# Patient Record
Sex: Female | Born: 1945 | Race: Black or African American | Hispanic: No | Marital: Single | Smoking: Current every day smoker
Health system: Southern US, Community
[De-identification: ages and names within clinical notes are randomized; demographics above are authoritative.]

## PROBLEM LIST (undated history)

## (undated) DIAGNOSIS — I10 Essential (primary) hypertension: Secondary | ICD-10-CM

## (undated) DIAGNOSIS — E119 Type 2 diabetes mellitus without complications: Secondary | ICD-10-CM

## (undated) HISTORY — PX: APPENDECTOMY: SHX54

---

## 2016-02-04 ENCOUNTER — Emergency Department (HOSPITAL_BASED_OUTPATIENT_CLINIC_OR_DEPARTMENT_OTHER)
Admission: EM | Admit: 2016-02-04 | Discharge: 2016-02-04 | Disposition: A | Discharge status: Home / Self Care | Payer: Medicare HMO | Attending: Emergency Medicine | Admitting: Emergency Medicine

## 2016-02-04 ENCOUNTER — Encounter (HOSPITAL_BASED_OUTPATIENT_CLINIC_OR_DEPARTMENT_OTHER): Payer: Self-pay | Admitting: Emergency Medicine

## 2016-02-04 ENCOUNTER — Emergency Department (HOSPITAL_BASED_OUTPATIENT_CLINIC_OR_DEPARTMENT_OTHER): Payer: Medicare HMO

## 2016-02-04 DIAGNOSIS — I10 Essential (primary) hypertension: Secondary | ICD-10-CM | POA: Insufficient documentation

## 2016-02-04 DIAGNOSIS — M542 Cervicalgia: Secondary | ICD-10-CM | POA: Diagnosis not present

## 2016-02-04 DIAGNOSIS — M25512 Pain in left shoulder: Secondary | ICD-10-CM | POA: Insufficient documentation

## 2016-02-04 DIAGNOSIS — R6883 Chills (without fever): Secondary | ICD-10-CM | POA: Insufficient documentation

## 2016-02-04 DIAGNOSIS — F17200 Nicotine dependence, unspecified, uncomplicated: Secondary | ICD-10-CM | POA: Insufficient documentation

## 2016-02-04 DIAGNOSIS — Z7984 Long term (current) use of oral hypoglycemic drugs: Secondary | ICD-10-CM | POA: Insufficient documentation

## 2016-02-04 DIAGNOSIS — J3489 Other specified disorders of nose and nasal sinuses: Secondary | ICD-10-CM | POA: Diagnosis not present

## 2016-02-04 DIAGNOSIS — Z79899 Other long term (current) drug therapy: Secondary | ICD-10-CM | POA: Diagnosis not present

## 2016-02-04 DIAGNOSIS — R109 Unspecified abdominal pain: Secondary | ICD-10-CM | POA: Insufficient documentation

## 2016-02-04 DIAGNOSIS — E119 Type 2 diabetes mellitus without complications: Secondary | ICD-10-CM | POA: Insufficient documentation

## 2016-02-04 DIAGNOSIS — M545 Low back pain, unspecified: Secondary | ICD-10-CM

## 2016-02-04 DIAGNOSIS — R51 Headache: Secondary | ICD-10-CM | POA: Insufficient documentation

## 2016-02-04 DIAGNOSIS — Z7982 Long term (current) use of aspirin: Secondary | ICD-10-CM | POA: Insufficient documentation

## 2016-02-04 HISTORY — DX: Essential (primary) hypertension: I10

## 2016-02-04 HISTORY — DX: Type 2 diabetes mellitus without complications: E11.9

## 2016-02-04 MED ORDER — HYDROCODONE-ACETAMINOPHEN 5-325 MG PO TABS: 1.0000 | ORAL_TABLET | Freq: Four times a day (QID) | ORAL | Status: AC | PRN

## 2016-02-04 MED ORDER — HYDROCODONE-ACETAMINOPHEN 5-325 MG PO TABS
1.0000 | ORAL_TABLET | Freq: Once | ORAL | Status: AC
  Administered 2016-02-04: 1 via ORAL
  Filled 2016-02-04: qty 1

## 2016-02-04 NOTE — ED Provider Notes (Signed)
CSN: [id number]     Arrival date & time 02/04/16  2032 History  By signing my name below, I, Phillis Haggis, attest that this documentation has been prepared under the direction and in the presence of Vanetta Mulders, MD. Electronically Signed: Phillis Haggis, ED Scribe. 02/04/2016. 10:47 PM.   Chief Complaint  Patient presents with  . Back Pain   The history is provided by the patient. No language interpreter was used.  HPI Comments: Mariah Sanchez is a 70 y.o. Female with a hx of DM and HTN who presents to the Emergency Department complaining of gradually resolving bilateral lower back pain that radiates up her back onset 5 days ago. She states that she was unable to get up on Friday due to her back pain. Pt reports intermittent radiating pain to the buttocks that radiates to the front. Pt reports sinus pressure, headache, intermittent left shoulder pain, chills, abdominal pain, and neck pain. She has not taken anything for her pain. Pt denies injury to the area, hx of back pain, use of major blood thinners, hx of bruising or bleeding easily, fever, visual disturbances, cough, sore throat, rhinorrhea, chest pain, SOB, leg swelling, nausea, vomiting, diarrhea, dysuria, rash, numbness, weakness, or headache.   Past Medical History  Diagnosis Date  . Diabetes mellitus without complication (HCC)   . Hypertension    Past Surgical History  Procedure Laterality Date  . Appendectomy    . Abdominal hysterectomy     History reviewed. No pertinent family history. Social History  Substance Use Topics  . Smoking status: Current Every Day Smoker  . Smokeless tobacco: None  . Alcohol Use: Yes   OB History    No data available     Review of Systems  Constitutional: Positive for chills. Negative for fever.  HENT: Positive for sinus pressure. Negative for rhinorrhea and sore throat.   Eyes: Negative for visual disturbance.  Respiratory: Negative for cough and shortness of breath.    Cardiovascular: Negative for chest pain and leg swelling.  Gastrointestinal: Positive for abdominal pain. Negative for nausea, vomiting and diarrhea.  Genitourinary: Negative for dysuria.  Musculoskeletal: Positive for back pain, arthralgias and neck pain.  Skin: Negative for rash.  Neurological: Positive for headaches. Negative for weakness and numbness.  Hematological: Does not bruise/bleed easily.  Psychiatric/Behavioral: Negative for confusion.   Allergies  Review of patient's allergies indicates no known allergies.  Home Medications   Prior to Admission medications   Medication Sig Start Date End Date Taking? Authorizing Provider  allopurinol (ZYLOPRIM) 100 MG tablet Take 100 mg by mouth daily.   Yes Historical Provider, MD  amLODipine (NORVASC) 10 MG tablet Take 10 mg by mouth daily.   Yes Historical Provider, MD  aspirin 81 MG tablet Take 81 mg by mouth daily.   Yes Historical Provider, MD  candesartan (ATACAND) 32 MG tablet Take 32 mg by mouth daily.   Yes Historical Provider, MD  clonazePAM (KLONOPIN) 0.5 MG tablet Take 0.5 mg by mouth 2 (two) times daily as needed for anxiety.   Yes Historical Provider, MD  cyclobenzaprine (FLEXERIL) 5 MG tablet Take 5 mg by mouth 3 (three) times daily as needed for muscle spasms.   Yes Historical Provider, MD  esomeprazole (NEXIUM) 40 MG capsule Take 40 mg by mouth daily at 12 noon.   Yes Historical Provider, MD  folic acid (FOLVITE) 800 MCG tablet Take 400 mcg by mouth daily.   Yes Historical Provider, MD  furosemide (LASIX) 20 MG tablet Take  20 mg by mouth.   Yes Historical Provider, MD  metFORMIN (GLUCOPHAGE) 500 MG tablet Take by mouth 2 (two) times daily with a meal.   Yes Historical Provider, MD  metoprolol succinate (TOPROL-XL) 100 MG 24 hr tablet Take 100 mg by mouth daily. Take with or immediately following a meal.   Yes Historical Provider, MD  montelukast (SINGULAIR) 10 MG tablet Take 10 mg by mouth at bedtime.   Yes Historical  Provider, MD  rosuvastatin (CRESTOR) 40 MG tablet Take 40 mg by mouth daily.   Yes Historical Provider, MD  vitamin E (VITAMIN E) 400 UNIT capsule Take 400 Units by mouth daily.   Yes Historical Provider, MD  HYDROcodone-acetaminophen (NORCO/VICODIN) 5-325 MG tablet Take 1 tablet by mouth every 6 (six) hours as needed for moderate pain. 02/04/16   Vanetta Mulders, MD   BP 163/88 mmHg  Pulse 89  Temp(Src) 98.8 F (37.1 C) (Oral)  Resp 18  Ht  (1.499 m)  Wt 72.576 kg  BMI 32.30 kg/m2  SpO2 99% Physical Exam  Constitutional: She is oriented to person, place, and time. She appears well-developed and well-nourished. No distress.  HENT:  Head: Normocephalic and atraumatic.  Mouth/Throat: Oropharynx is clear and moist and mucous membranes are normal.  Eyes: EOM are normal. Pupils are equal, round, and reactive to light. No scleral icterus.  Neck: Normal range of motion. Neck supple.  Cardiovascular: Normal rate and regular rhythm.   Pulmonary/Chest: Effort normal and breath sounds normal.  Abdominal: Soft. Bowel sounds are normal. There is no tenderness.  Musculoskeletal: Normal range of motion.  No swelling to the bilateral ankles; back non-tender to palpation with no deformity  Lymphadenopathy:    She has no cervical adenopathy.  Neurological: She is alert and oriented to person, place, and time. No cranial nerve deficit. She exhibits normal muscle tone. Coordination normal.  Skin: Skin is warm and dry. She is not diaphoretic.  Psychiatric: She has a normal mood and affect. Her behavior is normal.    ED Course  Procedures (including critical care time) DIAGNOSTIC STUDIES: Oxygen Saturation is 99% on RA, normal by my interpretation.    COORDINATION OF CARE: 9:30 PM-Discussed treatment plan which includes x-ray with pt at bedside and pt agreed to plan.    Labs Review Labs Reviewed - No data to display  Imaging Review Dg Lumbar Spine Complete  02/04/2016  CLINICAL DATA:   Lumbosacral back pain.  Pain for 5 days. EXAM: LUMBAR SPINE - COMPLETE 4+ VIEW COMPARISON:  None. FINDINGS: The alignment is maintained. Vertebral body heights are normal. There is no listhesis. The posterior elements are intact. Endplate spurring at L4-L5 with preservation of disc space. No fracture. Facet arthropathy at L4-L5 and L5-S1. Sacroiliac joints are symmetric, with mild degenerative change. 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 No acute bony abnormality. Electronically Signed   By: Rubye Oaks M.D.   On: 02/04/2016 22:40   I have personally reviewed and evaluated these images and lab results as part of my medical decision-making.   EKG Interpretation None      MDM   Final diagnoses:  Bilateral low back pain without sciatica   Patient without acute injury to the back. No neuro focal deficits. X-rays of the lower back show facet arthropathy in the lower lumbar spine could explain the patient's pain. No evidence of any sciatica. Will treat symptomatically.    I personally performed the services described in this documentation,  which was scribed in my presence. The recorded information has been reviewed and is accurate.      Vanetta Mulders, MD 02/04/16 2253

## 2016-02-04 NOTE — Discharge Instructions (Signed)
X-rays of the lower back with evidence of some arthritic changes but no evidence of any compression fractures. Take pain medicine as directed. Make an appoint with follow-up with your regular doctor. Return for any new or worse symptoms.

## 2016-02-04 NOTE — ED Notes (Signed)
C/o lower back pain since Friday,  Denies inj, ambulatory without diff  Denies urinary sx

## 2016-02-04 NOTE — ED Notes (Signed)
Pt in c/o bilateral lower back pain onset today. States some radiation into buttocks now. Ambulatory with steady gait in NAD.

## 2017-03-18 IMAGING — CR DG LUMBAR SPINE COMPLETE 4+V
5 series · 5 of 5 positions shown · non-contrast
Comparison: None.

CLINICAL DATA: Lumbosacral back pain.  Pain for 5 days.

EXAM:
LUMBAR SPINE - COMPLETE 4+ VIEW

[t l-spine a.p.]
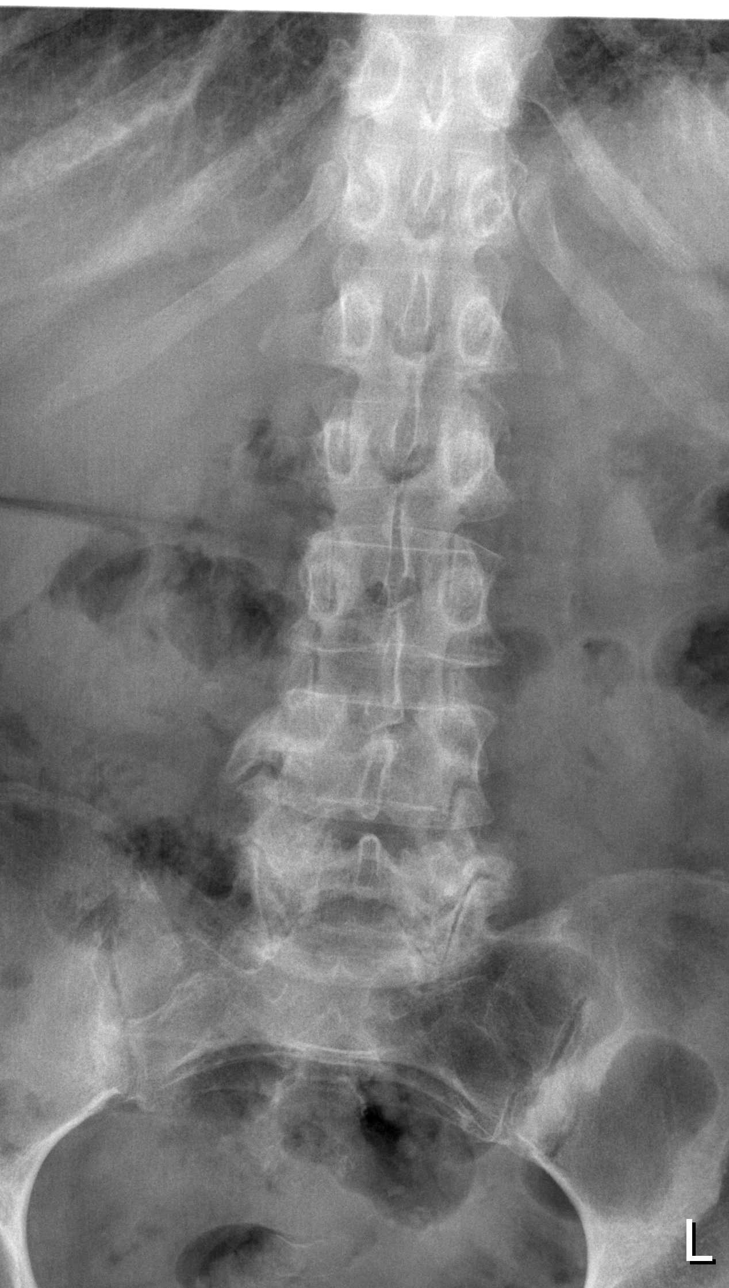

[t l-spine oblique exposure (1 of 2)]
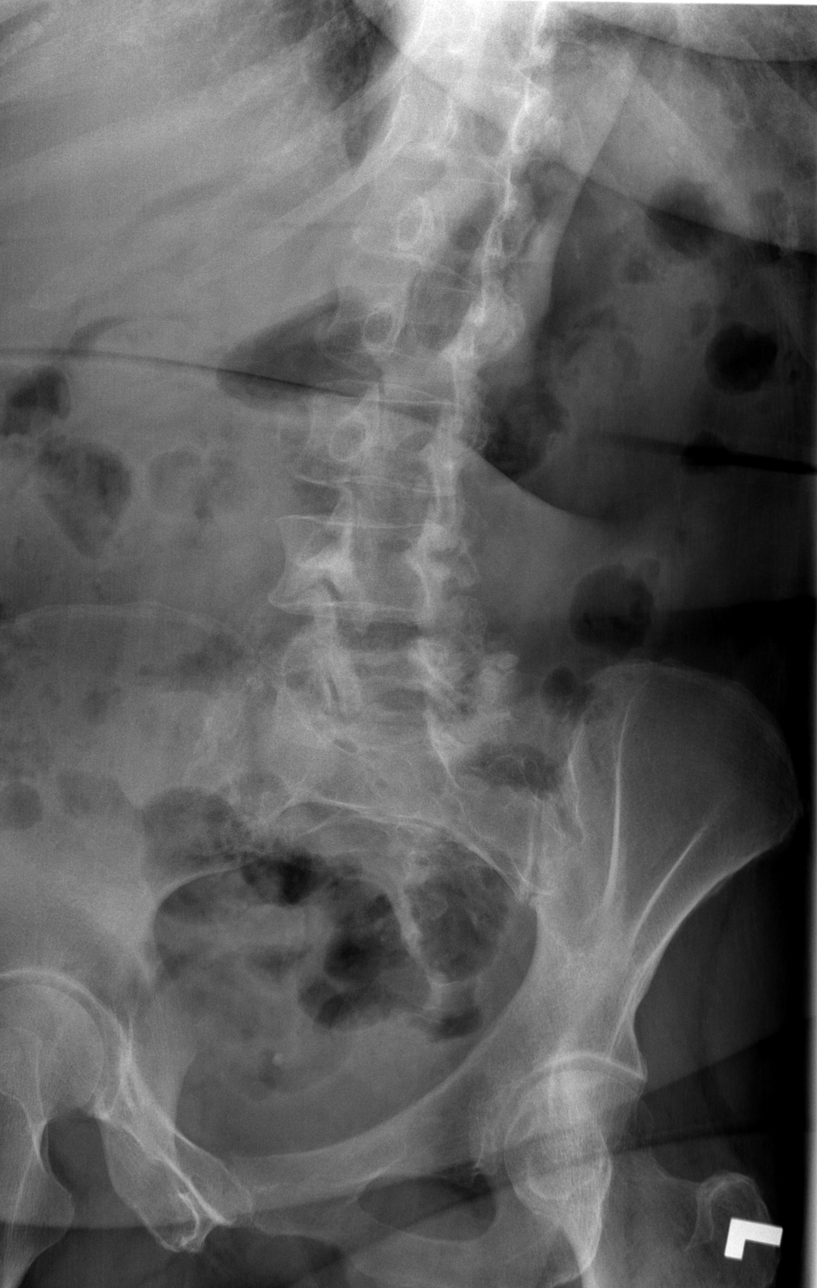

[t l-spine oblique exposure (2 of 2)]
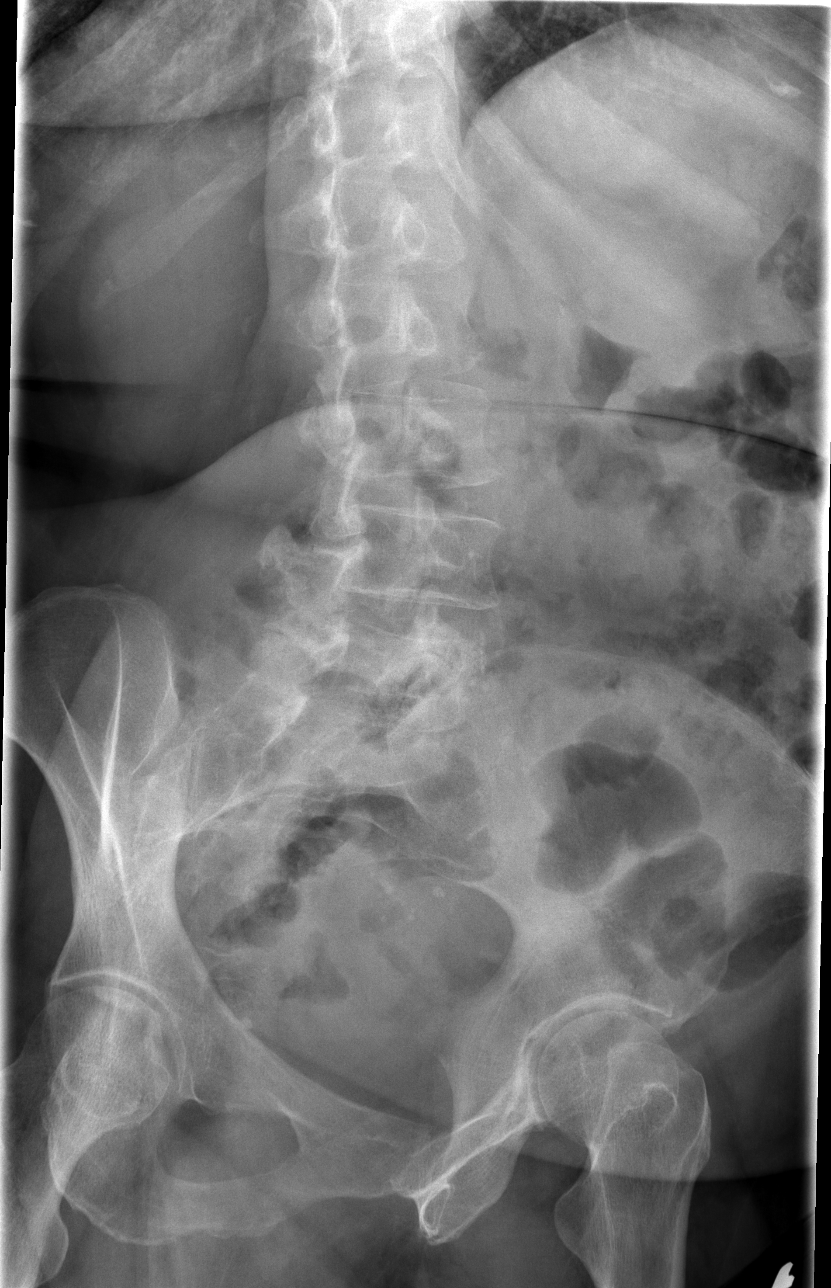

[t l-spine lat]
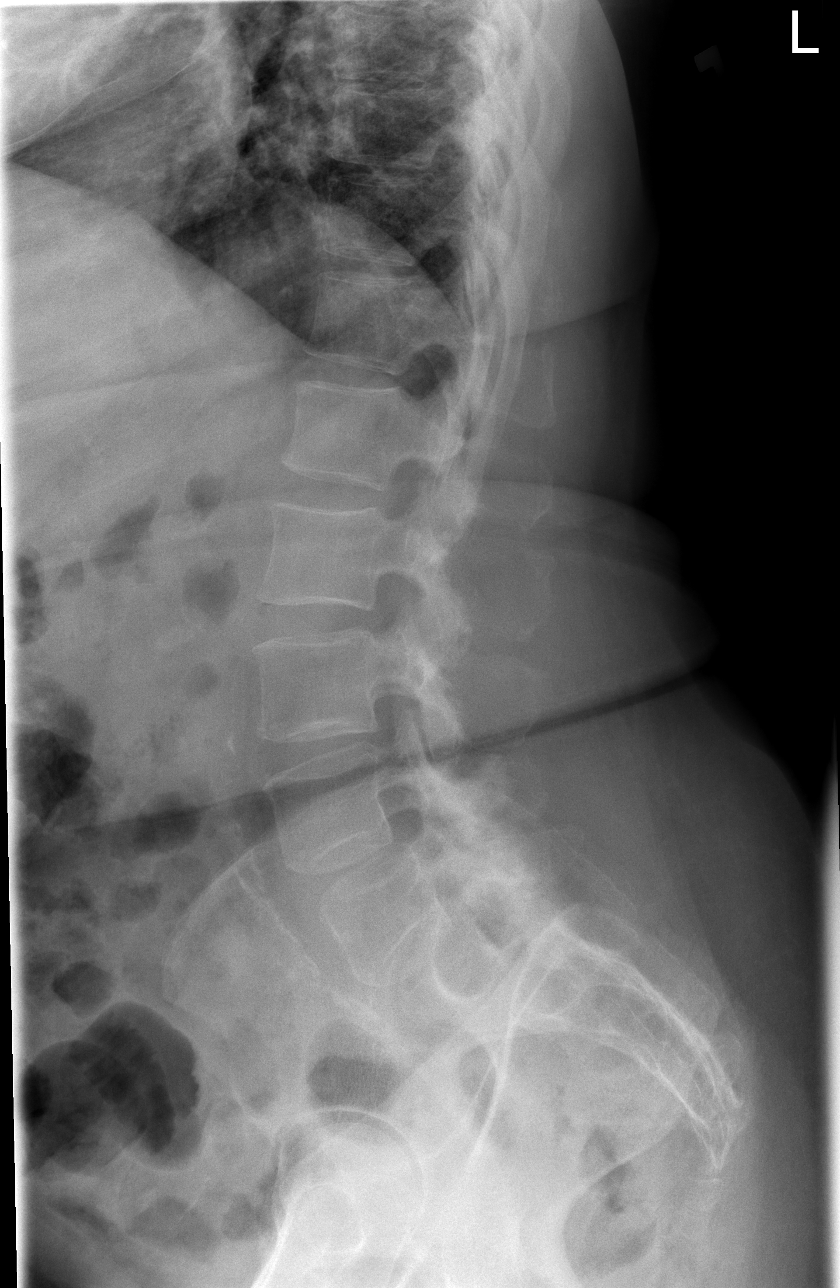

[t l-spine l5-s1 spot]
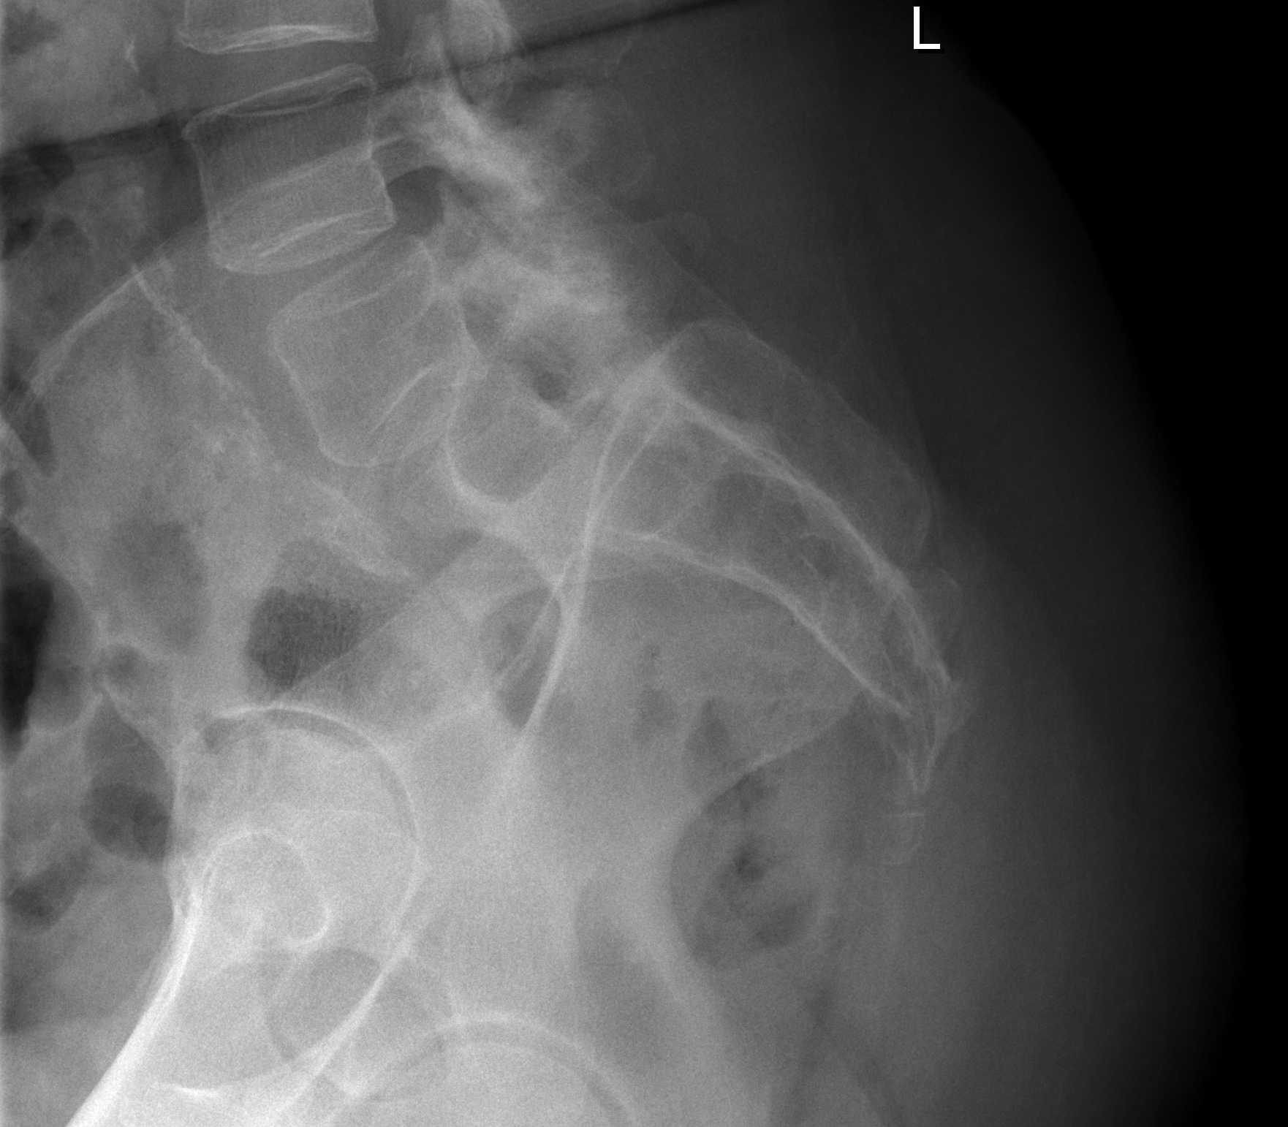

[5 of 5 positions shown; findings below may reference images not displayed]

FINDINGS: The alignment is maintained. Vertebral body heights are normal.
There is no listhesis. The posterior elements are intact. Endplate
spurring at L4-L5 with preservation of disc space. No fracture.
Facet arthropathy at L4-L5 and L5-S1. Sacroiliac joints are
symmetric, with mild degenerative change.
IMPRESSION: Facet arthropathy in the lower lumbar spine at L4-L5 and L5-S1. No
acute bony abnormality.

## 2017-08-15 ENCOUNTER — Emergency Department (HOSPITAL_BASED_OUTPATIENT_CLINIC_OR_DEPARTMENT_OTHER): Payer: Medicare HMO

## 2017-08-15 ENCOUNTER — Emergency Department (HOSPITAL_BASED_OUTPATIENT_CLINIC_OR_DEPARTMENT_OTHER)
Admission: EM | Admit: 2017-08-15 | Discharge: 2017-08-15 | Disposition: A | Discharge status: Home / Self Care | Payer: Medicare HMO | Attending: Emergency Medicine | Admitting: Emergency Medicine

## 2017-08-15 ENCOUNTER — Encounter (HOSPITAL_BASED_OUTPATIENT_CLINIC_OR_DEPARTMENT_OTHER): Payer: Self-pay

## 2017-08-15 DIAGNOSIS — Z7982 Long term (current) use of aspirin: Secondary | ICD-10-CM | POA: Insufficient documentation

## 2017-08-15 DIAGNOSIS — Z79899 Other long term (current) drug therapy: Secondary | ICD-10-CM | POA: Diagnosis not present

## 2017-08-15 DIAGNOSIS — I10 Essential (primary) hypertension: Secondary | ICD-10-CM | POA: Insufficient documentation

## 2017-08-15 DIAGNOSIS — F17200 Nicotine dependence, unspecified, uncomplicated: Secondary | ICD-10-CM | POA: Diagnosis not present

## 2017-08-15 DIAGNOSIS — E119 Type 2 diabetes mellitus without complications: Secondary | ICD-10-CM | POA: Diagnosis not present

## 2017-08-15 DIAGNOSIS — K2900 Acute gastritis without bleeding: Secondary | ICD-10-CM

## 2017-08-15 DIAGNOSIS — R1010 Upper abdominal pain, unspecified: Secondary | ICD-10-CM | POA: Diagnosis present

## 2017-08-15 DIAGNOSIS — Z7984 Long term (current) use of oral hypoglycemic drugs: Secondary | ICD-10-CM | POA: Diagnosis not present

## 2017-08-15 LAB — CBC WITH DIFFERENTIAL/PLATELET
BASOS ABS: 0.1 10*3/uL (ref 0.0–0.1)
BASOS PCT: 1 %
Eosinophils Absolute: 0.1 10*3/uL (ref 0.0–0.7)
Eosinophils Relative: 1 %
HCT: 43.8 % (ref 36.0–46.0)
HEMOGLOBIN: 14.6 g/dL (ref 12.0–15.0)
Lymphocytes Relative: 17 %
Lymphs Abs: 1.8 10*3/uL (ref 0.7–4.0)
MCH: 23.4 pg — ABNORMAL LOW (ref 26.0–34.0)
MCHC: 33.3 g/dL (ref 30.0–36.0)
MCV: 70.3 fL — ABNORMAL LOW (ref 78.0–100.0)
Monocytes Absolute: 0.9 10*3/uL (ref 0.1–1.0)
Monocytes Relative: 8 %
NEUTROS PCT: 73 %
Neutro Abs: 7.8 10*3/uL — ABNORMAL HIGH (ref 1.7–7.7)
PLATELETS: 324 10*3/uL (ref 150–400)
RBC: 6.23 MIL/uL — AB (ref 3.87–5.11)
RDW: 18.7 % — ABNORMAL HIGH (ref 11.5–15.5)
WBC: 10.6 10*3/uL — AB (ref 4.0–10.5)

## 2017-08-15 LAB — COMPREHENSIVE METABOLIC PANEL
ALBUMIN: 4.5 g/dL (ref 3.5–5.0)
ALT: 23 U/L (ref 14–54)
ANION GAP: 11 (ref 5–15)
AST: 38 U/L (ref 15–41)
Alkaline Phosphatase: 65 U/L (ref 38–126)
BILIRUBIN TOTAL: 0.5 mg/dL (ref 0.3–1.2)
BUN: 10 mg/dL (ref 6–20)
CO2: 27 mmol/L (ref 22–32)
Calcium: 9.4 mg/dL (ref 8.9–10.3)
Chloride: 100 mmol/L — ABNORMAL LOW (ref 101–111)
Creatinine, Ser: 0.8 mg/dL (ref 0.44–1.00)
GLUCOSE: 112 mg/dL — AB (ref 65–99)
POTASSIUM: 4 mmol/L (ref 3.5–5.1)
Sodium: 138 mmol/L (ref 135–145)
Total Protein: 8.3 g/dL — ABNORMAL HIGH (ref 6.5–8.1)

## 2017-08-15 LAB — URINALYSIS, MICROSCOPIC (REFLEX): RBC / HPF: NONE SEEN RBC/hpf (ref 0–5)

## 2017-08-15 LAB — LIPASE, BLOOD: LIPASE: 25 U/L (ref 11–51)

## 2017-08-15 LAB — URINALYSIS, ROUTINE W REFLEX MICROSCOPIC
Bilirubin Urine: NEGATIVE
Glucose, UA: NEGATIVE mg/dL
Hgb urine dipstick: NEGATIVE
Ketones, ur: NEGATIVE mg/dL
NITRITE: NEGATIVE
PROTEIN: NEGATIVE mg/dL
SPECIFIC GRAVITY, URINE: 1.01 (ref 1.005–1.030)
pH: 7 (ref 5.0–8.0)

## 2017-08-15 LAB — TROPONIN I

## 2017-08-15 MED ORDER — FAMOTIDINE 20 MG PO TABS: 20.0000 mg | ORAL_TABLET | Freq: Two times a day (BID) | ORAL | 0 refills | Status: AC

## 2017-08-15 NOTE — ED Triage Notes (Signed)
C/o LUQ,left flank pain x 2 weeks-NAD-steady gait

## 2017-08-15 NOTE — ED Provider Notes (Signed)
[facility] Provider [facility]   CSN: [id number] Arrival date & time: 08/15/17  1529     History   Chief Complaint Chief Complaint  Patient presents with  . Abdominal Pain    HPI Mariah Sanchez is a 71 y.o. female.  HPI  71 year old female with a history of hypertension and diabetes presents with intermittent left upper quadrant and left back pain for the past 2 weeks. She states it occurs randomly and she has not found anything that causes such as eating or exertion. There is no chest pain or shortness of breath. No associated nausea, vomiting or diarrhea. She has had a little bit of constipation. When the pain comes it last 3 or 4 hours. It feels like a burning and sometimes a stinging sensation. She states she has a lot of chronic sinus drainage into her throat. She has not had any fevers or urinary symptoms. She has not tried any new medicines for these symptoms. She is chronically on Nexium 40 mg.  Past Medical History:  Diagnosis Date  . Diabetes mellitus without complication (HCC)   . Hypertension     There are no active problems to display for this patient.   Past Surgical History:  Procedure Laterality Date  . APPENDECTOMY      OB History    No data available       Home Medications    Prior to Admission medications   Medication Sig Start Date End Date Taking? Authorizing Provider  allopurinol (ZYLOPRIM) 100 MG tablet Take 100 mg by mouth daily.    [provider]  amLODipine (NORVASC) 10 MG tablet Take 10 mg by mouth daily.    [provider]  aspirin 81 MG tablet Take 81 mg by mouth daily.    [provider]  candesartan (ATACAND) 32 MG tablet Take 32 mg by mouth daily.    [provider]  clonazePAM (KLONOPIN) 0.5 MG tablet Take 0.5 mg by mouth 2 (two) times daily as needed for anxiety.    [provider]  cyclobenzaprine (FLEXERIL) 5 MG tablet Take 5 mg by mouth 3 (three) times daily as needed for  muscle spasms.    [provider]  esomeprazole (NEXIUM) 40 MG capsule Take 40 mg by mouth daily at 12 noon.    [provider]  famotidine (PEPCID) 20 MG tablet Take 1 tablet (20 mg total) by mouth 2 (two) times daily. 08/15/17   Pricilla Loveless, MD  folic acid (FOLVITE) 800 MCG tablet Take 400 mcg by mouth daily.    [provider]  furosemide (LASIX) 20 MG tablet Take 20 mg by mouth.    [provider]  HYDROcodone-acetaminophen (NORCO/VICODIN) 5-325 MG tablet Take 1 tablet by mouth every 6 (six) hours as needed for moderate pain. 02/04/16   Vanetta Mulders, MD  metFORMIN (GLUCOPHAGE) 500 MG tablet Take by mouth 2 (two) times daily with a meal.    [provider]  metoprolol succinate (TOPROL-XL) 100 MG 24 hr tablet Take 100 mg by mouth daily. Take with or immediately following a meal.    [provider]  montelukast (SINGULAIR) 10 MG tablet Take 10 mg by mouth at bedtime.    [provider]  rosuvastatin (CRESTOR) 40 MG tablet Take 40 mg by mouth daily.    [provider]  vitamin E (VITAMIN E) 400 UNIT capsule Take 400 Units by mouth daily.    [provider]    Family History No family  history on file.  Social History Social History  Substance Use Topics  . Smoking status: Current Every Day Smoker  . Smokeless tobacco: Never Used  . Alcohol use Yes     Comment: weekly     Allergies   Patient has no known allergies.   Review of Systems Review of Systems  Constitutional: Negative for fever.  Respiratory: Negative for shortness of breath.   Cardiovascular: Negative for chest pain.  Gastrointestinal: Positive for abdominal pain and constipation. Negative for diarrhea, nausea and vomiting.  Genitourinary: Negative for dysuria and hematuria.  Musculoskeletal: Positive for back pain.  All other systems reviewed and are negative.    Physical Exam Updated Vital Signs BP (!) 158/96 (BP Location:  Right Arm)   Pulse 83   Temp 98.4 F (36.9 C) (Oral)   Resp 18   Ht 4\' 11"  (1.499 m)   Wt 75 kg (165 lb 5.5 oz)   SpO2 98%   BMI 33.40 kg/m   Physical Exam  Constitutional: She is oriented to person, place, and time. She appears well-developed and well-nourished. No distress.  HENT:  Head: Normocephalic and atraumatic.  Right Ear: External ear normal.  Left Ear: External ear normal.  Nose: Nose normal.  Eyes: Right eye exhibits no discharge. Left eye exhibits no discharge.  Cardiovascular: Normal rate, regular rhythm and normal heart sounds.   Pulmonary/Chest: Effort normal and breath sounds normal.  Abdominal: Soft. There is tenderness in the right upper quadrant, epigastric area and left upper quadrant. There is CVA tenderness (mild, left sided). There is negative Murphy's sign.  Moderate epigastric tenderness, mild LUQ, RUQ tenderness  Neurological: She is alert and oriented to person, place, and time.  Skin: Skin is warm and dry. She is not diaphoretic.  Nursing note and vitals reviewed.    ED Treatments / Results  Labs (all labs ordered are listed, but only abnormal results are displayed) Labs Reviewed  COMPREHENSIVE METABOLIC PANEL - Abnormal; Notable for the following:       Result Value   Chloride 100 (*)    Glucose, Bld 112 (*)    Total Protein 8.3 (*)    All other components within normal limits  CBC WITH DIFFERENTIAL/PLATELET - Abnormal; Notable for the following:    WBC 10.6 (*)    RBC 6.23 (*)    MCV 70.3 (*)    MCH 23.4 (*)    RDW 18.7 (*)    Neutro Abs 7.8 (*)    All other components within normal limits  URINALYSIS, ROUTINE W REFLEX MICROSCOPIC - Abnormal; Notable for the following:    APPearance CLOUDY (*)    Leukocytes, UA SMALL (*)    All other components within normal limits  URINALYSIS, MICROSCOPIC (REFLEX) - Abnormal; Notable for the following:    Bacteria, UA FEW (*)    Squamous Epithelial / LPF 6-30 (*)    All other components within  normal limits  LIPASE, BLOOD  TROPONIN I    EKG  EKG Interpretation  Date/Time:  Monday August 15 2017 16:23:44 EDT Ventricular Rate:  76 PR Interval:    QRS Duration: 76 QT Interval:  387 QTC Calculation: 436 R Axis:   59 Text Interpretation:  Sinus rhythm Nonspecific T abnrm, anterolateral leads No old tracing to compare Confirmed by Pricilla Loveless [phone]) on 08/15/2017 4:29:31 PM Also confirmed by Pricilla Loveless [phone]), editor Madalyn Rob [phone])  on 08/15/2017 4:43:05 PM       EKG Interpretation  Date/Time:  Monday  August 15 2017 17:44:30 EDT Ventricular Rate:  82 PR Interval:    QRS Duration: 80 QT Interval:  376 QTC Calculation: 440 R Axis:   69 Text Interpretation:  Sinus rhythm Nonspecific T abnrm, anterolateral leads no significant change compared to earlier in the day Confirmed by Pricilla LovelessGoldston, Cono Gebhard [phone]([id number]) on 08/15/2017 5:47:01 PM        Radiology [place] Abdomen Limited Ruq  Result Date: 08/15/2017 CLINICAL DATA:  71 year old female with upper abdominal pain for 2 weeks. EXAM: ULTRASOUND ABDOMEN LIMITED RIGHT UPPER QUADRANT COMPARISON:  None. FINDINGS: Gallbladder: The gallbladder is unremarkable. No evidence of cholelithiasis or acute cholecystitis. Common bile duct: Diameter: 1.6 mm. No evidence of intrahepatic or extrahepatic biliary dilatation. Liver: Increased echogenicity noted. No focal lesions identified. Portal vein is patent on color Doppler imaging with normal direction of blood flow towards the liver. IMPRESSION: 1. Hepatic steatosis without other significant abnormality. Electronically Signed   By: Harmon PierJeffrey  Hu M.D.   On: 08/15/2017 17:07    Procedures Procedures (including critical care time)  Medications Ordered in ED Medications - No data to display   Initial Impression / Assessment and Plan / ED Course  I have reviewed the triage vital signs and the nursing notes.  Pertinent labs & imaging results that were available during my care of the  patient were reviewed by me and considered in my medical decision making (see chart for details).     I think the patient's pain is most likely gastritis. She has not had any of the pain while in the ED for multiple hours. Given she is a diabetic, 70, and hypertensive and ECG and troponin were obtained. ECG with nonspecific T waves but no old to compare to. Given continued no pain, repeat was obtained with no dynamic changes. My suspicion for ACS is less likely. She is physically tender on exam. Given this ultrasound was obtained which shows no gallbladder or liver pathology. Lipase is normal. I do not think emergent CT is needed. Will try Pepcid and have also counseled on Maalox and/or Tums. Follow-up with PCP, possibly GI. Discussed return precautions.  Final Clinical Impressions(s) / ED Diagnoses   Final diagnoses:  Upper abdominal pain  Acute gastritis without hemorrhage, unspecified gastritis type    New Prescriptions Discharge Medication List as of 08/15/2017  6:06 PM    START taking these medications   Details  famotidine (PEPCID) 20 MG tablet Take 1 tablet (20 mg total) by mouth 2 (two) times daily., Starting Mon 08/15/2017, Print         Pricilla LovelessGoldston, Leslieanne Cobarrubias, MD 08/15/17 [phone]
